# Patient Record
Sex: Female | Born: 1975 | Race: White | Hispanic: No | Marital: Married | Smoking: Never smoker
Health system: Southern US, Community
[De-identification: ages and names within clinical notes are randomized; demographics above are authoritative.]

## PROBLEM LIST (undated history)

## (undated) HISTORY — PX: OTHER SURGICAL HISTORY: SHX169

---

## 2009-02-11 ENCOUNTER — Inpatient Hospital Stay (HOSPITAL_COMMUNITY): Admission: AD | Admit: 2009-02-11 | Discharge: 2009-02-14 | Payer: Self-pay | Admitting: Obstetrics and Gynecology

## 2010-11-15 LAB — RPR: RPR Ser Ql: NONREACTIVE

## 2010-11-15 LAB — CBC
HCT: 28.1 % — ABNORMAL LOW (ref 36.0–46.0)
Hemoglobin: 13.4 g/dL (ref 12.0–15.0)
Hemoglobin: 9.8 g/dL — ABNORMAL LOW (ref 12.0–15.0)
MCV: 95.3 fL (ref 78.0–100.0)
Platelets: 132 10*3/uL — ABNORMAL LOW (ref 150–400)
RBC: 2.96 MIL/uL — ABNORMAL LOW (ref 3.87–5.11)
RBC: 4.05 MIL/uL (ref 3.87–5.11)
WBC: 9.3 10*3/uL (ref 4.0–10.5)
WBC: 9.4 10*3/uL (ref 4.0–10.5)

## 2010-12-25 NOTE — Discharge Summary (Signed)
NAMEBETHLEHEM, LANGSTAFF NO.:  [id number]   MEDICAL RECORD NO.:  [id number]          PATIENT TYPE:  INP   LOCATION:  9136                          FACILITY:  [facility]   PHYSICIAN:  Randye Lobo, M.D.   DATE OF BIRTH:  07/22/1976   DATE OF ADMISSION:  02/11/2009  DATE OF DISCHARGE:  02/14/2009                               DISCHARGE SUMMARY   FINAL DIAGNOSIS:  CANCELED DICTATION.      Leilani Able, P.A.-C.      Randye Lobo, M.D.  Electronically Signed    MB/MEDQ  D:  02/27/2009  T:  02/28/2009  Job:  [id number]

## 2012-02-14 ENCOUNTER — Institutional Professional Consult (permissible substitution): Payer: Self-pay | Admitting: Pediatrics

## 2012-02-24 ENCOUNTER — Ambulatory Visit (INDEPENDENT_AMBULATORY_CARE_PROVIDER_SITE_OTHER): Payer: Self-pay | Admitting: Pediatrics

## 2012-02-24 DIAGNOSIS — Z7681 Expectant parent(s) prebirth pediatrician visit: Secondary | ICD-10-CM

## 2012-02-24 NOTE — Progress Notes (Signed)
Pre natal counseling done 

## 2021-09-26 ENCOUNTER — Other Ambulatory Visit: Payer: Self-pay

## 2021-09-26 ENCOUNTER — Emergency Department (HOSPITAL_BASED_OUTPATIENT_CLINIC_OR_DEPARTMENT_OTHER): Payer: BC Managed Care – PPO | Admitting: Radiology

## 2021-09-26 ENCOUNTER — Encounter (HOSPITAL_BASED_OUTPATIENT_CLINIC_OR_DEPARTMENT_OTHER): Payer: Self-pay

## 2021-09-26 ENCOUNTER — Emergency Department (HOSPITAL_BASED_OUTPATIENT_CLINIC_OR_DEPARTMENT_OTHER)
Admission: EM | Admit: 2021-09-26 | Discharge: 2021-09-26 | Disposition: A | Discharge status: Home / Self Care | Payer: BC Managed Care – PPO | Attending: Emergency Medicine | Admitting: Emergency Medicine

## 2021-09-26 DIAGNOSIS — S52351A Displaced comminuted fracture of shaft of radius, right arm, initial encounter for closed fracture: Secondary | ICD-10-CM | POA: Diagnosis not present

## 2021-09-26 DIAGNOSIS — S52531A Colles' fracture of right radius, initial encounter for closed fracture: Secondary | ICD-10-CM | POA: Insufficient documentation

## 2021-09-26 DIAGNOSIS — S6991XA Unspecified injury of right wrist, hand and finger(s), initial encounter: Secondary | ICD-10-CM | POA: Diagnosis not present

## 2021-09-26 DIAGNOSIS — Y9351 Activity, roller skating (inline) and skateboarding: Secondary | ICD-10-CM | POA: Diagnosis not present

## 2021-09-26 MED ORDER — HYDROMORPHONE HCL 1 MG/ML IJ SOLN
0.5000 mg | Freq: Once | INTRAMUSCULAR | Status: AC
  Administered 2021-09-26: 0.5 mg via INTRAVENOUS
  Filled 2021-09-26: qty 1

## 2021-09-26 MED ORDER — PROPOFOL 10 MG/ML IV BOLUS
INTRAVENOUS | Status: DC | PRN
  Administered 2021-09-26: 30 mg via INTRAVENOUS
  Administered 2021-09-26: 20 mg via INTRAVENOUS

## 2021-09-26 MED ORDER — OXYCODONE HCL 5 MG PO TABS: 5.0000 mg | ORAL_TABLET | ORAL | 0 refills | Status: DC | PRN

## 2021-09-26 MED ORDER — PROPOFOL 10 MG/ML IV BOLUS
1.0000 mg/kg | Freq: Once | INTRAVENOUS | Status: DC
  Filled 2021-09-26: qty 20

## 2021-09-26 MED ORDER — ONDANSETRON HCL 4 MG/2ML IJ SOLN
4.0000 mg | Freq: Once | INTRAMUSCULAR | Status: AC
  Administered 2021-09-26: 4 mg via INTRAVENOUS
  Filled 2021-09-26: qty 2

## 2021-09-26 NOTE — ED Notes (Signed)
RT in with MD for conscious sedation procedure.  Airway standby.

## 2021-09-26 NOTE — ED Triage Notes (Signed)
Patient reports roller skating with her son when he pulled her down causing her to fall and fell backwards onto her right hand. Pt has swelling and possible deformity to right wrist.

## 2021-09-26 NOTE — Discharge Instructions (Addendum)
You were evaluated in the Emergency Department and after careful evaluation, we did not find any emergent condition requiring admission or further testing in the hospital.  You broke a bone in your wrist, which we reduced here in the emergency department for better alignment.  You still may need surgery.  Important you follow-up with Dr. Yehuda Budd the hand specialist.  Please call the number provided tomorrow morning to schedule a formal appointment time for this week.  Recommend Tylenol 1000 mg every 4-6 hours and/or Motrin 600 mg every 4-6 hours for pain.  You can use the oxycodone medication for more significant pain.  Please return to the Emergency Department if you experience any worsening of your condition.  Thank you for allowing [place] to be a part of your care.

## 2021-09-26 NOTE — ED Notes (Signed)
Dr. Pilar Plate present during sugar tong splinting

## 2021-09-26 NOTE — ED Notes (Signed)
Discharged instructions reviewed with patient and husband.  Gave verbal indications understood such.  Left with husband ambulatory without difficulty

## 2021-09-27 DIAGNOSIS — S52531A Colles' fracture of right radius, initial encounter for closed fracture: Secondary | ICD-10-CM | POA: Diagnosis not present

## 2021-09-27 NOTE — ED Provider Notes (Signed)
[facility] Emergency Department Provider Note MRN:  [id number]  Arrival date & time: 09/27/21     Chief Complaint   Hand Pain   History of Present Illness   Rebecca Oneal is a 46 y.o. year-old female with no pertinent past medical history presenting to the ED with chief complaint of hand pain.  Patient fell onto her outstretched hand while she was playing with her children.  Immediate pain and deformity to the right hand/wrist.  Denies head trauma, no other injuries or complaints.  Review of Systems  A thorough review of systems was obtained and all systems are negative except as noted in the HPI and PMH.   Patient's Health History   History reviewed. No pertinent past medical history.  Past Surgical History:  Procedure Laterality Date   wisdom teeth removal      History reviewed. No pertinent family history.  Social History   Socioeconomic History   Marital status: Married    Spouse name: Not on file   Number of children: Not on file   Years of education: Not on file   Highest education level: Not on file  Occupational History   Not on file  Tobacco Use   Smoking status: Never   Smokeless tobacco: Never  Substance and Sexual Activity   Alcohol use: Yes    Comment: occ   Drug use: Never   Sexual activity: Yes  Other Topics Concern   Not on file  Social History Narrative   Not on file   Social Determinants of Health   Financial Resource Strain: Not on file  Food Insecurity: Not on file  Transportation Needs: Not on file  Physical Activity: Not on file  Stress: Not on file  Social Connections: Not on file  Intimate Partner Violence: Not on file     Physical Exam   Vitals:   09/26/21 1745 09/26/21 1815  BP: 128/76 120/73  Pulse: 78 76  Resp: 10 17  Temp:    SpO2: 100% 100%    CONSTITUTIONAL: Well-appearing, NAD NEURO/PSYCH:  Alert and oriented x 3, no focal deficits EYES:  eyes equal and reactive ENT/NECK:  no LAD, no  JVD CARDIO: Regular rate, well-perfused, normal S1 and S2 PULM:  CTAB no wheezing or rhonchi GI/GU:  non-distended, non-tender MSK/SPINE: Gross deformity to the right wrist, neurovascularly intact distally SKIN:  no rash, atraumatic   *Additional and/or pertinent findings included in MDM below  Diagnostic and Interventional Summary    EKG Interpretation  Date/Time:    Ventricular Rate:    PR Interval:    QRS Duration:   QT Interval:    QTC Calculation:   R Axis:     Text Interpretation:         Labs Reviewed - No data to display  DG Wrist Complete Right  Final Result    DG Wrist Complete Right  Final Result      Medications  HYDROmorphone (DILAUDID) injection 0.5 mg (0.5 mg Intravenous Given 09/26/21 1553)  ondansetron (ZOFRAN) injection 4 mg (4 mg Intravenous Given 09/26/21 1554)     Procedures  /  Critical Care .Sedation  Date/Time: 09/27/2021 12:02 AM Performed by: Sabas Sous, MD Authorized by: Sabas Sous, MD   Consent:    Consent obtained:  Verbal and written   Consent given by:  Patient   Risks discussed:  Allergic reaction, dysrhythmia, nausea, vomiting, respiratory compromise necessitating ventilatory assistance and intubation, prolonged sedation necessitating reversal and prolonged hypoxia  resulting in organ damage Universal protocol:    Immediately prior to procedure, a time out was called: yes     Patient identity confirmed:  Arm band and verbally with patient Indications:    Procedure performed:  Fracture reduction   Procedure necessitating sedation performed by:  Physician performing sedation Pre-sedation assessment:    Time since last food or drink:  4 hours   ASA classification: class 1 - normal, healthy patient     Mouth opening:  3 or more finger widths   Mallampati score:  I - soft palate, uvula, fauces, pillars visible   Neck mobility: normal     Pre-sedation assessments completed and reviewed: airway patency, cardiovascular  function, hydration status, mental status, nausea/vomiting, pain level, respiratory function and temperature   Immediate pre-procedure details:    Reviewed: vital signs, relevant labs/tests and NPO status     Verified: bag valve mask available, emergency equipment available, intubation equipment available, IV patency confirmed, oxygen available and suction available   Procedure details (see MAR for exact dosages):    Preoxygenation:  Nasal cannula   Sedation:  Propofol   Intended level of sedation: deep   Analgesia:  Hydromorphone   Intra-procedure monitoring:  Blood pressure monitoring, cardiac monitor, continuous pulse oximetry, continuous capnometry, frequent LOC assessments and frequent vital sign checks   Intra-procedure events: none     Total Provider sedation time (minutes):  24 Post-procedure details:    Attendance: Constant attendance by certified staff until patient recovered     Recovery: Patient returned to pre-procedure baseline     Post-sedation assessments completed and reviewed: airway patency, cardiovascular function, hydration status, mental status, nausea/vomiting, pain level, respiratory function and temperature     Patient is stable for discharge or admission: yes     Procedure completion:  Tolerated well, no immediate complications Reduction of fracture  Date/Time: 09/27/2021 12:04 AM Performed by: Sabas Sous, MD Authorized by: Sabas Sous, MD  Consent: Verbal consent obtained. Written consent obtained. Consent given by: patient Patient understanding: patient states understanding of the procedure being performed Patient consent: the patient's understanding of the procedure matches consent given Relevant documents: relevant documents present and verified Test results: test results available and properly labeled Site marked: the operative site was marked Imaging studies: imaging studies available Patient identity confirmed: verbally with patient and arm  band Time out: Immediately prior to procedure a "time out" was called to verify the correct patient, procedure, equipment, support staff and site/side marked as required. Preparation: Patient was prepped and draped in the usual sterile fashion.  Sedation: Patient sedated: yes Sedatives: propofol Analgesia: hydromorphone  Patient tolerance: patient tolerated the procedure well with no immediate complications Comments: Reduction of right distal radius fracture   .Splint Application  Date/Time: 09/27/2021 12:04 AM Performed by: Sabas Sous, MD Authorized by: Sabas Sous, MD   Consent:    Consent obtained:  Verbal and written   Consent given by:  Patient   Risks, benefits, and alternatives were discussed: yes     Risks discussed:  Discoloration, numbness, pain and swelling   Alternatives discussed:  No treatment Universal protocol:    Procedure explained and questions answered to patient or proxy's satisfaction: yes     Immediately prior to procedure a time out was called: yes     Patient identity confirmed:  Verbally with patient and arm band Pre-procedure details:    Distal neurologic exam:  Normal   Distal perfusion: distal pulses strong and brisk  capillary refill   Procedure details:    Location:  Arm   Arm location:  R lower arm   Splint type:  Sugar tong   Supplies:  Fiberglass   Attestation: Splint applied and adjusted personally by me   Post-procedure details:    Distal neurologic exam:  Normal   Distal perfusion: distal pulses strong     Procedure completion:  Tolerated well, no immediate complications   Post-procedure imaging: reviewed    ED Course and Medical Decision Making  Initial Impression and Ddx Excluded wrist injury, clinically suspicious for Colles' fracture.  Confirmed on x-ray.  Discussed case with Dr. Yehuda Budd, appropriate for reduction and follow-up in the office this week.  See procedural details above.  Tolerated well.  Past medical/surgical  history that increases complexity of ED encounter: None  Interpretation of Diagnostics I personally reviewed the wrist x-ray and my interpretation is as follows: Colles' fracture with improvement postreduction      Patient Reassessment and Ultimate Disposition/Management Discharge home  Patient management required discussion with the following services or consulting groups:  Orthopedic Surgery  Complexity of Problems Addressed Acute complicated illness or Injury  Additional Data Reviewed and Analyzed Further history obtained from: Further history from spouse/family member  Additional Factors Impacting ED Encounter Risk Use of parenteral controlled substances and Major procedures  Elmer Sow. Pilar Plate, MD [facility] Emergency Medicine [facility] [email or web address]  Final Clinical Impressions(s) / ED Diagnoses     ICD-10-CM   1. Closed Colles' fracture of right radius, initial encounter  S52.531A     2. Injury while roller skating  Y93.51 DG Wrist Complete Right    DG Wrist Complete Right      ED Discharge Orders          Ordered    oxyCODONE (ROXICODONE) 5 MG immediate release tablet  Every 4 hours PRN,   Status:  Discontinued        09/26/21 1754    oxyCODONE (ROXICODONE) 5 MG immediate release tablet  Every 4 hours PRN        09/26/21 1849             Discharge Instructions Discussed with and Provided to Patient:    Discharge Instructions      You were evaluated in the Emergency Department and after careful evaluation, we did not find any emergent condition requiring admission or further testing in the hospital.  You broke a bone in your wrist, which we reduced here in the emergency department for better alignment.  You still may need surgery.  Important you follow-up with Dr. Yehuda Budd the hand specialist.  Please call the number provided tomorrow morning to schedule a formal appointment time for this week.  Recommend Tylenol 1000 mg every 4-6  hours and/or Motrin 600 mg every 4-6 hours for pain.  You can use the oxycodone medication for more significant pain.  Please return to the Emergency Department if you experience any worsening of your condition.  Thank you for allowing [place] to be a part of your care.       Sabas Sous, MD 09/27/21 [phone]

## 2021-09-29 DIAGNOSIS — M25531 Pain in right wrist: Secondary | ICD-10-CM | POA: Diagnosis not present

## 2021-09-29 DIAGNOSIS — S52501A Unspecified fracture of the lower end of right radius, initial encounter for closed fracture: Secondary | ICD-10-CM | POA: Diagnosis not present

## 2021-10-01 ENCOUNTER — Encounter (HOSPITAL_BASED_OUTPATIENT_CLINIC_OR_DEPARTMENT_OTHER): Payer: Self-pay | Admitting: Orthopedic Surgery

## 2021-10-01 ENCOUNTER — Other Ambulatory Visit: Payer: Self-pay

## 2021-10-01 NOTE — Progress Notes (Signed)
Spoke w/ via phone for pre-op interview--- Rebecca Oneal----      UPT         Lab results------ COVID test -----patient states asymptomatic no test needed Arrive at -------1030 NPO after MN NO Solid Food.  Clear liquids from MN until---0930 Med rec completed Medications to take morning of surgery -----Oxy IR PRN Diabetic medication ----- Patient instructed no nail polish to be worn day of surgery Patient instructed to bring photo id and insurance card day of surgery Patient aware to have Driver (ride ) / caregiver  Maricza Bhola Husband   for 24 hours after surgery  Patient Special Instructions ----- Pre-Op special Istructions ----- Patient verbalized understanding of instructions that were given at this phone interview. Patient denies shortness of breath, chest pain, fever, cough at this phone interview.

## 2021-10-03 NOTE — H&P (Signed)
Preoperative History & Physical Exam  Surgeon: Philipp Ovens, MD  Diagnosis: Right wrist fracture  Planned Procedure: Procedure(s) (LRB): OPEN REDUCTION INTERNAL FIXATION (ORIF) WRIST FRACTURE (Right)  History of Present Illness:   Patient is a 46 y.o. female with symptoms consistent with Right wrist fracture who presents for surgical intervention. The risks, benefits and alternatives of surgical intervention were discussed and informed consent was obtained prior to surgery.  Past Medical History: History reviewed. No pertinent past medical history.  Past Surgical History:  Past Surgical History:  Procedure Laterality Date   wisdom teeth removal      Medications:  Prior to Admission medications   Medication Sig Start Date End Date Taking? Authorizing Provider  oxyCODONE (ROXICODONE) 5 MG immediate release tablet Take 1 tablet (5 mg total) by mouth every 4 (four) hours as needed for severe pain. 09/26/21  Yes Sabas Sous, MD    Allergies:  Patient has no known allergies.  Review of Systems: Negative except per HPI.  Physical Exam: Alert and oriented, NAD Head and neck: no masses, normal alignment CV: pulse intact Pulm: no increased work of breathing, respirations even and unlabored Abdomen: non-distended Extremities: extremities warm and well perfused  LABS: No results found for this or any previous visit (from the past 2160 hour(s)).   Complete History and Physical exam available in the office notes  Gomez Cleverly

## 2021-10-05 ENCOUNTER — Ambulatory Visit (HOSPITAL_BASED_OUTPATIENT_CLINIC_OR_DEPARTMENT_OTHER): Payer: BC Managed Care – PPO | Admitting: Anesthesiology

## 2021-10-05 ENCOUNTER — Other Ambulatory Visit: Payer: Self-pay

## 2021-10-05 ENCOUNTER — Encounter (HOSPITAL_BASED_OUTPATIENT_CLINIC_OR_DEPARTMENT_OTHER)
Admission: RE | Disposition: A | Discharge status: Home / Self Care | Payer: Self-pay | Source: Ambulatory Visit | Attending: Orthopedic Surgery

## 2021-10-05 ENCOUNTER — Encounter (HOSPITAL_BASED_OUTPATIENT_CLINIC_OR_DEPARTMENT_OTHER): Payer: Self-pay | Admitting: Orthopedic Surgery

## 2021-10-05 ENCOUNTER — Ambulatory Visit (HOSPITAL_BASED_OUTPATIENT_CLINIC_OR_DEPARTMENT_OTHER): Payer: BC Managed Care – PPO

## 2021-10-05 ENCOUNTER — Ambulatory Visit (HOSPITAL_BASED_OUTPATIENT_CLINIC_OR_DEPARTMENT_OTHER)
Admission: RE | Admit: 2021-10-05 | Discharge: 2021-10-05 | Disposition: A | Discharge status: Home / Self Care | Payer: BC Managed Care – PPO | Source: Ambulatory Visit | Attending: Orthopedic Surgery | Admitting: Orthopedic Surgery

## 2021-10-05 DIAGNOSIS — X58XXXA Exposure to other specified factors, initial encounter: Secondary | ICD-10-CM | POA: Diagnosis not present

## 2021-10-05 DIAGNOSIS — S52501A Unspecified fracture of the lower end of right radius, initial encounter for closed fracture: Secondary | ICD-10-CM | POA: Diagnosis not present

## 2021-10-05 DIAGNOSIS — S52571A Other intraarticular fracture of lower end of right radius, initial encounter for closed fracture: Secondary | ICD-10-CM | POA: Diagnosis not present

## 2021-10-05 DIAGNOSIS — S62101A Fracture of unspecified carpal bone, right wrist, initial encounter for closed fracture: Secondary | ICD-10-CM

## 2021-10-05 HISTORY — PX: ORIF WRIST FRACTURE: SHX2133

## 2021-10-05 LAB — POCT PREGNANCY, URINE: Preg Test, Ur: NEGATIVE

## 2021-10-05 SURGERY — OPEN REDUCTION INTERNAL FIXATION (ORIF) WRIST FRACTURE
Anesthesia: Monitor Anesthesia Care | Site: Wrist | Laterality: Right

## 2021-10-05 MED ORDER — CEFAZOLIN SODIUM-DEXTROSE 2-4 GM/100ML-% IV SOLN
INTRAVENOUS | Status: AC
  Filled 2021-10-05: qty 100

## 2021-10-05 MED ORDER — BACITRACIN ZINC 500 UNIT/GM EX OINT
TOPICAL_OINTMENT | CUTANEOUS | Status: DC | PRN
  Administered 2021-10-05: 1 via TOPICAL

## 2021-10-05 MED ORDER — PHENYLEPHRINE 40 MCG/ML (10ML) SYRINGE FOR IV PUSH (FOR BLOOD PRESSURE SUPPORT)
PREFILLED_SYRINGE | INTRAVENOUS | Status: DC | PRN
  Administered 2021-10-05 (×4): 40 ug via INTRAVENOUS

## 2021-10-05 MED ORDER — FENTANYL CITRATE (PF) 100 MCG/2ML IJ SOLN
100.0000 ug | Freq: Once | INTRAMUSCULAR | Status: AC
  Administered 2021-10-05: 100 ug via INTRAVENOUS

## 2021-10-05 MED ORDER — DEXAMETHASONE SODIUM PHOSPHATE 10 MG/ML IJ SOLN
INTRAMUSCULAR | Status: DC | PRN
  Administered 2021-10-05: 5 mg

## 2021-10-05 MED ORDER — PROPOFOL 500 MG/50ML IV EMUL
INTRAVENOUS | Status: DC | PRN
  Administered 2021-10-05: 75 ug/kg/min via INTRAVENOUS

## 2021-10-05 MED ORDER — 0.9 % SODIUM CHLORIDE (POUR BTL) OPTIME
TOPICAL | Status: DC | PRN
  Administered 2021-10-05: 1000 mL

## 2021-10-05 MED ORDER — DEXMEDETOMIDINE (PRECEDEX) IN NS 20 MCG/5ML (4 MCG/ML) IV SYRINGE
PREFILLED_SYRINGE | INTRAVENOUS | Status: DC | PRN
  Administered 2021-10-05 (×3): 4 ug via INTRAVENOUS

## 2021-10-05 MED ORDER — OXYCODONE-ACETAMINOPHEN 5-325 MG PO TABS: 1.0000 | ORAL_TABLET | Freq: Four times a day (QID) | ORAL | 0 refills | Status: AC | PRN

## 2021-10-05 MED ORDER — ROPIVACAINE HCL 5 MG/ML IJ SOLN
INTRAMUSCULAR | Status: DC | PRN
  Administered 2021-10-05: 25 mL via PERINEURAL

## 2021-10-05 MED ORDER — LACTATED RINGERS IV SOLN: INTRAVENOUS | Status: DC

## 2021-10-05 MED ORDER — LIDOCAINE HCL (CARDIAC) PF 100 MG/5ML IV SOSY
PREFILLED_SYRINGE | INTRAVENOUS | Status: DC | PRN
Start: 2021-10-05 — End: 2021-10-05
  Administered 2021-10-05: 30 mg via INTRAVENOUS

## 2021-10-05 MED ORDER — MIDAZOLAM HCL 2 MG/2ML IJ SOLN
2.0000 mg | Freq: Once | INTRAMUSCULAR | Status: AC
  Administered 2021-10-05: 2 mg via INTRAVENOUS

## 2021-10-05 MED ORDER — ACETAMINOPHEN 500 MG PO TABS
ORAL_TABLET | ORAL | Status: AC
  Filled 2021-10-05: qty 2

## 2021-10-05 MED ORDER — PROPOFOL 10 MG/ML IV BOLUS
INTRAVENOUS | Status: DC | PRN
  Administered 2021-10-05: 30 mg via INTRAVENOUS

## 2021-10-05 MED ORDER — FENTANYL CITRATE (PF) 100 MCG/2ML IJ SOLN
INTRAMUSCULAR | Status: AC
  Filled 2021-10-05: qty 2

## 2021-10-05 MED ORDER — MIDAZOLAM HCL 2 MG/2ML IJ SOLN
INTRAMUSCULAR | Status: AC
  Filled 2021-10-05: qty 2

## 2021-10-05 MED ORDER — CEFAZOLIN SODIUM-DEXTROSE 2-4 GM/100ML-% IV SOLN
2.0000 g | INTRAVENOUS | Status: AC
  Administered 2021-10-05: 2 g via INTRAVENOUS

## 2021-10-05 MED ORDER — ACETAMINOPHEN 500 MG PO TABS
1000.0000 mg | ORAL_TABLET | Freq: Once | ORAL | Status: AC
  Administered 2021-10-05: 1000 mg via ORAL

## 2021-10-05 SURGICAL SUPPLY — 67 items
BIT DRILL 2.2 SS TIBIAL (BIT) ×1 IMPLANT
BLADE SURG 15 STRL LF DISP TIS (BLADE) ×2 IMPLANT
BLADE SURG 15 STRL SS (BLADE) ×4
BNDG CMPR 9X4 STRL LF SNTH (GAUZE/BANDAGES/DRESSINGS) ×1
BNDG COHESIVE 1X5 TAN STRL LF (GAUZE/BANDAGES/DRESSINGS) IMPLANT
BNDG ELASTIC 2X5.8 VLCR STR LF (GAUZE/BANDAGES/DRESSINGS) IMPLANT
BNDG ELASTIC 4X5.8 VLCR STR LF (GAUZE/BANDAGES/DRESSINGS) ×2 IMPLANT
BNDG ESMARK 4X9 LF (GAUZE/BANDAGES/DRESSINGS) ×2 IMPLANT
CORD BIPOLAR FORCEPS 12FT (ELECTRODE) ×2 IMPLANT
COVER BACK TABLE 60X90IN (DRAPES) ×2 IMPLANT
CUFF TOURN SGL QUICK 18X4 (TOURNIQUET CUFF) IMPLANT
CUFF TOURN SGL QUICK 24 (TOURNIQUET CUFF)
CUFF TRNQT CYL 24X4X16.5-23 (TOURNIQUET CUFF) IMPLANT
DRAPE EXTREMITY T 121X128X90 (DISPOSABLE) ×2 IMPLANT
DRAPE OEC MINIVIEW 54X84 (DRAPES) ×2 IMPLANT
DRAPE SHEET LG 3/4 BI-LAMINATE (DRAPES) ×2 IMPLANT
DRSG EMULSION OIL 3X3 NADH (GAUZE/BANDAGES/DRESSINGS) ×2 IMPLANT
GAUZE 4X4 16PLY ~~LOC~~+RFID DBL (SPONGE) ×2 IMPLANT
GAUZE SPONGE 4X4 12PLY STRL (GAUZE/BANDAGES/DRESSINGS) ×2 IMPLANT
GLOVE SURG NEOP MICRO LF SZ6.5 (GLOVE) ×1 IMPLANT
GLOVE SURG UNDER POLY LF SZ6.5 (GLOVE) ×1 IMPLANT
GLOVE SURG UNDER POLY LF SZ7 (GLOVE) ×1 IMPLANT
GLOVE SURG UNDER POLY LF SZ7.5 (GLOVE) ×5 IMPLANT
GOWN STRL REUS W/ TWL LRG LVL3 (GOWN DISPOSABLE) IMPLANT
GOWN STRL REUS W/TWL LRG LVL3 (GOWN DISPOSABLE) ×6 IMPLANT
HIBICLENS CHG 4% 4OZ BTL (MISCELLANEOUS) ×2 IMPLANT
K-WIRE 1.6 (WIRE) ×6
K-WIRE DBL END TROCAR 6X.045 (WIRE)
K-WIRE DBL END TROCAR 6X.062 (WIRE)
K-WIRE FX5X1.6XNS BN SS (WIRE) ×3
KIT TURNOVER CYSTO (KITS) ×2 IMPLANT
KWIRE DBL END TROCAR 6X.045 (WIRE) IMPLANT
KWIRE DBL END TROCAR 6X.062 (WIRE) IMPLANT
KWIRE FX5X1.6XNS BN SS (WIRE) IMPLANT
NEEDLE HYPO 22GX1.5 SAFETY (NEEDLE) IMPLANT
NS IRRIG 1000ML POUR BTL (IV SOLUTION) ×2 IMPLANT
NS IRRIG 500ML POUR BTL (IV SOLUTION) ×2 IMPLANT
PACK BASIN DAY SURGERY FS (CUSTOM PROCEDURE TRAY) ×2 IMPLANT
PAD CAST 4YDX4 CTTN HI CHSV (CAST SUPPLIES) ×2 IMPLANT
PADDING CAST ABS 4INX4YD NS (CAST SUPPLIES) ×1
PADDING CAST ABS COTTON 4X4 ST (CAST SUPPLIES) IMPLANT
PADDING CAST COTTON 4X4 STRL (CAST SUPPLIES) ×4
PEG LOCKING SMOOTH 2.2X16 (Screw) ×2 IMPLANT
PEG LOCKING SMOOTH 2.2X18 (Peg) ×2 IMPLANT
PEG LOCKING SMOOTH 2.2X20 (Screw) ×1 IMPLANT
PLATE CROSSLOCK NAR MINI RT (Plate) ×1 IMPLANT
SCREW LOCK 12X2.7X 3 LD (Screw) IMPLANT
SCREW LOCK 14X2.7X 3 LD TPR (Screw) IMPLANT
SCREW LOCK 16X2.7X 3 LD TPR (Screw) IMPLANT
SCREW LOCKING 2.7X12MM (Screw) ×2 IMPLANT
SCREW LOCKING 2.7X14 (Screw) ×4 IMPLANT
SCREW LOCKING 2.7X16 (Screw) ×2 IMPLANT
SLEEVE SCD COMPRESS KNEE MED (STOCKING) IMPLANT
SLING ARM FOAM STRAP MED (SOFTGOODS) ×1 IMPLANT
SPLINT PLASTER CAST XFAST 3X15 (CAST SUPPLIES) IMPLANT
SPLINT PLASTER XTRA FASTSET 3X (CAST SUPPLIES) ×1
STRIP CLOSURE SKIN 1/2X4 (GAUZE/BANDAGES/DRESSINGS) ×2 IMPLANT
SUCTION FRAZIER HANDLE 10FR (MISCELLANEOUS)
SUCTION TUBE FRAZIER 10FR DISP (MISCELLANEOUS) IMPLANT
SUT CHROMIC 4 0 PS 2 18 (SUTURE) IMPLANT
SUT ETHILON 4 0 PS 2 18 (SUTURE) ×2 IMPLANT
SYR 10ML LL (SYRINGE) IMPLANT
SYR BULB EAR ULCER 3OZ GRN STR (SYRINGE) ×2 IMPLANT
TOWEL OR 17X26 10 PK STRL BLUE (TOWEL DISPOSABLE) ×2 IMPLANT
TRAY DSU PREP LF (CUSTOM PROCEDURE TRAY) ×2 IMPLANT
TUBE CONNECTING 12X1/4 (SUCTIONS) IMPLANT
UNDERPAD 30X36 HEAVY ABSORB (UNDERPADS AND DIAPERS) ×2 IMPLANT

## 2021-10-05 NOTE — Op Note (Signed)
OPERATIVE NOTE  DATE OF PROCEDURE: 10/05/2021  SURGEONS: Primary: Orene Desanctis, MD  PREOPERATIVE DIAGNOSIS: Right wrist fracture  POSTOPERATIVE DIAGNOSIS: Same  NAME OF PROCEDURE:    Right distal radius open reduction internal fixation, Intra- articular, 3 fragments 2.    Right wrist brachioradialis tenotomy  3.    Right wrist radiographs four views with intraoperative interpretation  ANESTHESIA: Regional Block + MAC  SKIN PREPARATION: Hibiclens  ESTIMATED BLOOD LOSS: Minimal  IMPLANTS: Biomet DVR Crosslock volar plate and screws  Implant Name Type Inv. Item Serial No. Manufacturer Lot No. LRB No. Used Action  PEG LOCKING SMOOTH 2.2X18 - [id number] Peg PEG LOCKING SMOOTH 2.2X18  [employer](ORTH,TRAU,BIO,SG)  Right 2 Implanted  PEG LOCKING SMOOTH 2.2X20 - [id number] Screw PEG LOCKING SMOOTH 2.2X20  [employer](ORTH,TRAU,BIO,SG)  Right 1 Implanted  SCREW LOCKING 2.7X16 - [id number] Screw SCREW LOCKING 2.7X16  [employer](ORTH,TRAU,BIO,SG)  Right 1 Implanted  PEG LOCKING SMOOTH 2.2X16 - [id number] Screw PEG LOCKING SMOOTH 2.2X16  [employer](ORTH,TRAU,BIO,SG)  Right 2 Implanted  SCREW LOCKING 2.7X14 - [id number] Screw SCREW LOCKING 2.7X14  [employer](ORTH,TRAU,BIO,SG)  Right 2 Implanted  PLATE CROSSLOCK NAR MINI RT - [id number] Plate PLATE CROSSLOCK NAR MINI RT  [employer](ORTH,TRAU,BIO,SG)  Right 1 Implanted    INDICATIONS:  Rebecca Oneal is a 46 y.o. female who has the above preoperative diagnosis. The patient has decided to proceed with surgical intervention.  Risks, benefits and alternatives of operative management were discussed including, but not limited to, risks of anesthesia complications, infection, pain, persistent symptoms, stiffness, need for future surgery.  The patient understands, agrees and elects to proceed with surgery.    DESCRIPTION OF PROCEDURE: The patient was met in the pre-operative area and their identity was verified.  The operative location and laterality was  also verified and marked.  The patient was brought to the OR and was placed supine on the table.  After repeat patient identification with the operative team anesthesia was provided and the patient was prepped and draped in the usual sterile fashion.  A final timeout was performed verifying the correction patient, procedure, location and laterality.  Preoperative antibiotics were administered. The right upper extremity was exsanguinated with an Esmarch and tourniquet inflated to 264mHg. Under loupe magnification, an incision was made directly over the flexor carpi radialis (FCR) tendon. Bipolar was utilized for hemostasis. The roof of the FCR tendon sheath was incised. The FCR tendon was then retracted ulnarly to protect the palmar cutaneous branch of the median nerve. Subsequently, the floor of the FCR tendon sheath was incised over the distal end of the radius.  The flexor pollicis longus (FPL) was swept ulnarly to reveal the pronator quadratus.  The pronator quadratus fascia was incised from its distal and radial borders. A periosteal elevator was utilized to mobilize the pronator quadratus muscle off the distal radius.  The fracture site was irrigated and prepared for reduction with a freer and adson forceps. There were 3 distal radius fracture fragments. The brachioradialis tendon insertion was released to facilitate reduction.  This release was performed by identifying the broad insertion of the brachiradialis tendon and also identifying the 1st dorsal compartment tendons.  The 1st dorsal compartment tendons were protected, the broad tendon insertion was released under direct visualization. The fracture was reduced and provisionally fixed with K-wires. We then selected a proper length and width volar plate.  The plate was placed on the distal end of the radius with the fracture reduced and secured to the bone. Using mini C-arm  the fracture reduction and position of the plate were deemed to be satisfactory.   We proceeded with securing the plate to the radius with the 1 bicortical nonlocking screw in the oblong portion of the shaft.  With the intermediate column reduced, we secured the distal end of the plate with 2 screws in the distal ulnar portion of plate.  We again used the C-arm to verify satisfactory plate position as well as fracture reduction. The radial column was then reduced and radial styloid locking screws were placed. The remainder shaft screws were placed through the plate. We used the mini C-arm to verify satisfactory plate position, screw lengths and fracture reduction. The DRUJ was then tested in neutral, pronation and supination and was found to be stable.The tourniquet was deflated. Meticulous hemostasis was obtained. The incision was copiously irrigated with normal saline and closed with interrupted 4-0 nylon horizontal mattress sutures. The incision was covered with xeroform, sterile guaze, webril and well padded short arm splint. The fingers were pink, warm and well perfused. All counts were correct. The patient was awoken from anesthesia and brought to PACU for recovery in stable condition.   Matt Holmes, MD Orthopaedic Hand Surgery

## 2021-10-05 NOTE — Anesthesia Procedure Notes (Signed)
Anesthesia Regional Block: Supraclavicular block   Pre-Anesthetic Checklist: , timeout performed,  Correct Patient, Correct Site, Correct Laterality,  Correct Procedure, Correct Position, site marked,  Risks and benefits discussed,  Surgical consent,  Pre-op evaluation,  At surgeon's request and post-op pain management  Laterality: Right  Prep: Maximum Sterile Barrier Precautions used, chloraprep       Needles:  Injection technique: Single-shot  Needle Type: Echogenic Stimulator Needle     Needle Length: 5cm  Needle Gauge: 22     Additional Needles:   Procedures:,,,, ultrasound used (permanent image in chart),,    Narrative:  Start time: 10/05/2021 11:30 AM End time: 10/05/2021 11:33 AM Injection made incrementally with aspirations every 5 mL.  Performed by: Personally  Anesthesiologist: Elmer Picker, MD  Additional Notes: Monitors applied. No increased pain on injection. No increased resistance to injection. Injection made in 5cc increments. Good needle visualization. Patient tolerated procedure well.

## 2021-10-05 NOTE — Transfer of Care (Signed)
Immediate Anesthesia Transfer of Care Note  Patient: Rebecca Oneal  Procedure(s) Performed: OPEN REDUCTION INTERNAL FIXATION (ORIF) WRIST FRACTURE (Right: Wrist)  Patient Location: PACU  Anesthesia Type:MAC and Regional  Level of Consciousness: awake, alert  and oriented  Airway & Oxygen Therapy: Patient Spontanous Breathing  Post-op Assessment: Report given to RN and Post -op Vital signs reviewed and stable  Post vital signs: Reviewed and stable  Last Vitals:  Vitals Value Taken Time  BP    Temp    Pulse    Resp    SpO2      Last Pain:  Vitals:   10/05/21 1102  TempSrc: Oral  PainSc: 0-No pain      Patients Stated Pain Goal: 3 (56/38/93 7342)  Complications: No notable events documented.

## 2021-10-05 NOTE — Interval H&P Note (Signed)
History and Physical Interval Note:  10/05/2021 12:11 PM  Rebecca Oneal  has presented today for surgery, with the diagnosis of Right wrist fracture.  The various methods of treatment have been discussed with the patient and family. After consideration of risks, benefits and other options for treatment, the patient has consented to  Procedure(s) with comments: OPEN REDUCTION INTERNAL FIXATION (ORIF) WRIST FRACTURE (Right) - with MAC anesthesia needs 90 minutes as a surgical intervention.  The patient's history has been reviewed, patient examined, no change in status, stable for surgery.  I have reviewed the patient's chart and labs.  Questions were answered to the patient's satisfaction.     Orene Desanctis

## 2021-10-05 NOTE — Anesthesia Preprocedure Evaluation (Addendum)
Anesthesia Evaluation  Patient identified by MRN, date of birth, ID band Patient awake    Reviewed: Allergy & Precautions, NPO status , Patient's Chart, lab work & pertinent test results  Airway Mallampati: III  TM Distance: >3 FB Neck ROM: Full  Mouth opening: Limited Mouth Opening  Dental no notable dental hx. (+) Teeth Intact, Dental Advisory Given   Pulmonary neg pulmonary ROS,    Pulmonary exam normal breath sounds clear to auscultation       Cardiovascular negative cardio ROS Normal cardiovascular exam Rhythm:Regular Rate:Normal     Neuro/Psych negative neurological ROS  negative psych ROS   GI/Hepatic negative GI ROS, Neg liver ROS,   Endo/Other  negative endocrine ROS  Renal/GU negative Renal ROS  negative genitourinary   Musculoskeletal negative musculoskeletal ROS (+)   Abdominal   Peds  Hematology negative hematology ROS (+)   Anesthesia Other Findings   Reproductive/Obstetrics                            Anesthesia Physical Anesthesia Plan  ASA: 1  Anesthesia Plan: MAC and Regional   Post-op Pain Management: Tylenol PO (pre-op)* and Regional block*   Induction: Intravenous  PONV Risk Score and Plan: 2 and Propofol infusion, Treatment may vary due to age or medical condition, Midazolam and Dexamethasone  Airway Management Planned: Natural Airway  Additional Equipment:   Intra-op Plan:   Post-operative Plan:   Informed Consent: I have reviewed the patients History and Physical, chart, labs and discussed the procedure including the risks, benefits and alternatives for the proposed anesthesia with the patient or authorized representative who has indicated his/her understanding and acceptance.     Dental advisory given  Plan Discussed with: CRNA  Anesthesia Plan Comments:         Anesthesia Quick Evaluation

## 2021-10-05 NOTE — Progress Notes (Signed)
Assisted Dr. Woodrum with right, ultrasound guided, supraclavicular block. Side rails up, monitors on throughout procedure. See vital signs in flow sheet. Tolerated Procedure well. 

## 2021-10-05 NOTE — Discharge Instructions (Addendum)
  Orthopaedic Hand Surgery Discharge Instructions  WEIGHT BEARING STATUS: Non weight bearing on operative extremity  DRESSING CARE: Please keep your dressing/splint/cast clean and dry until your follow-up appointment. You may shower by placing a waterproof covering over your dressing/splint/cast. Contact your surgeon if your splint/cast gets wet. It will need to be changed to prevent skin breakdown.  PAIN CONTROL: First line medications for post operative pain control are Tylenol (acetaminophen) and Motrin (ibuprofen) if you are able to take these medications. If you have been prescribed a medication these can be taken as breakthrough pain medications. Please note that some narcotic pain medication has acetaminophen added and you should never consume more than 4,000mg of acetaminophen in 24-hour period. Please note that if you are given Toradol (ketorolac) you should not take similar medications such as ibuprofen or naproxen.  DISCHARGE MEDICATIONS: If you have been prescribed medication it was sent electronically to your pharmacy. No changes have been made to your home medications.  ICE/ELEVATION: Ice and elevate your injured extremity as needed. Avoid direct contact of ice with skin.   BANDAGE FEELS TOO TIGHT: If your bandage feels too tight, first make sure you are elevating your fingers as much as possible. The outer layer of the bandage can be unwrapped and reapplied more loosely. If no improvement, you may carefully cut the inner layer longitudinally until the pressure has resolved and then rewrap the outer layer. If you are not comfortable with these instructions, please call the office and the bandage can be changed for you.   FOLLOW UP: You will be called after surgery with an appointment date and time, however if you have not received a phone call within 3 days, please call during regular office hours at [phone] to schedule a post operative appointment.  Please Seek Medical Attention  if: Call MD for: pain or pressure in chest, jaw, arm, back, neck  Call MD for: temperature greater than 101 F for more than 24 hrs Call MD for: difficulty breathing Call MD for: incision redness, bleeding, drainage  Call MD for: palpitations or feeling that the heart is racing  Call MD for: increased swelling in arm, leg, ankle, or abdomen  Call MD for: lightheadedness, dizziness, fainting Call 911 or go to ER for any medical emergency if you are not able to get in touch with your doctor   J. Reid Spears, MD Orthopaedic Hand Surgeon [facility] Office number: [phone] [address] [place], [place] [address]  Post Anesthesia Home Care Instructions  Activity: Get plenty of rest for the remainder of the day. A responsible individual must stay with you for 24 hours following the procedure.  For the next 24 hours, DO NOT: -Drive a car -Operate machinery -Drink alcoholic beverages -Take any medication unless instructed by your physician -Make any legal decisions or sign important papers.  Meals: Start with liquid foods such as gelatin or soup. Progress to regular foods as tolerated. Avoid greasy, spicy, heavy foods. If nausea and/or vomiting occur, drink only clear liquids until the nausea and/or vomiting subsides. Call your physician if vomiting continues.  Special Instructions/Symptoms: Your throat may feel dry or sore from the anesthesia or the breathing tube placed in your throat during surgery. If this causes discomfort, gargle with warm salt water. The discomfort should disappear within 24 hours.      

## 2021-10-06 NOTE — Anesthesia Postprocedure Evaluation (Signed)
Anesthesia Post Note  Patient: Rebecca Oneal  Procedure(s) Performed: OPEN REDUCTION INTERNAL FIXATION (ORIF) WRIST FRACTURE (Right: Wrist)     Patient location during evaluation: PACU Anesthesia Type: Regional and MAC Level of consciousness: awake and alert Pain management: pain level controlled Vital Signs Assessment: post-procedure vital signs reviewed and stable Respiratory status: spontaneous breathing, nonlabored ventilation, respiratory function stable and patient connected to nasal cannula oxygen Cardiovascular status: stable and blood pressure returned to baseline Postop Assessment: no apparent nausea or vomiting Anesthetic complications: no   No notable events documented.  Last Vitals:  Vitals:   10/05/21 1350 10/05/21 1420  BP: 106/86 113/63  Pulse: 80 73  Resp: 15 16  Temp: 36.6 C 36.6 C  SpO2: 96% 98%    Last Pain:  Vitals:   10/05/21 1420  TempSrc:   PainSc: 0-No pain                 Nyana Haren L Shamica Moree

## 2021-10-07 ENCOUNTER — Encounter (HOSPITAL_BASED_OUTPATIENT_CLINIC_OR_DEPARTMENT_OTHER): Payer: Self-pay | Admitting: Orthopedic Surgery

## 2021-10-07 DIAGNOSIS — S52501D Unspecified fracture of the lower end of right radius, subsequent encounter for closed fracture with routine healing: Secondary | ICD-10-CM | POA: Diagnosis not present

## 2021-10-21 DIAGNOSIS — S52501D Unspecified fracture of the lower end of right radius, subsequent encounter for closed fracture with routine healing: Secondary | ICD-10-CM | POA: Diagnosis not present

## 2021-10-21 DIAGNOSIS — M25531 Pain in right wrist: Secondary | ICD-10-CM | POA: Diagnosis not present

## 2021-11-06 DIAGNOSIS — M25531 Pain in right wrist: Secondary | ICD-10-CM | POA: Diagnosis not present

## 2021-11-13 DIAGNOSIS — M25531 Pain in right wrist: Secondary | ICD-10-CM | POA: Diagnosis not present

## 2021-11-18 DIAGNOSIS — S52501D Unspecified fracture of the lower end of right radius, subsequent encounter for closed fracture with routine healing: Secondary | ICD-10-CM | POA: Diagnosis not present

## 2021-11-18 DIAGNOSIS — M25531 Pain in right wrist: Secondary | ICD-10-CM | POA: Diagnosis not present

## 2021-11-27 DIAGNOSIS — M25531 Pain in right wrist: Secondary | ICD-10-CM | POA: Diagnosis not present

## 2021-12-02 DIAGNOSIS — M25531 Pain in right wrist: Secondary | ICD-10-CM | POA: Diagnosis not present

## 2021-12-09 DIAGNOSIS — S52501A Unspecified fracture of the lower end of right radius, initial encounter for closed fracture: Secondary | ICD-10-CM | POA: Diagnosis not present

## 2021-12-11 DIAGNOSIS — M25531 Pain in right wrist: Secondary | ICD-10-CM | POA: Diagnosis not present

## 2021-12-18 DIAGNOSIS — M25531 Pain in right wrist: Secondary | ICD-10-CM | POA: Diagnosis not present

## 2021-12-25 DIAGNOSIS — Z1151 Encounter for screening for human papillomavirus (HPV): Secondary | ICD-10-CM | POA: Diagnosis not present

## 2021-12-25 DIAGNOSIS — Z01419 Encounter for gynecological examination (general) (routine) without abnormal findings: Secondary | ICD-10-CM | POA: Diagnosis not present

## 2021-12-25 DIAGNOSIS — Z3202 Encounter for pregnancy test, result negative: Secondary | ICD-10-CM | POA: Diagnosis not present

## 2021-12-25 DIAGNOSIS — N926 Irregular menstruation, unspecified: Secondary | ICD-10-CM | POA: Diagnosis not present

## 2021-12-25 DIAGNOSIS — Z6822 Body mass index (BMI) 22.0-22.9, adult: Secondary | ICD-10-CM | POA: Diagnosis not present

## 2021-12-25 DIAGNOSIS — Z124 Encounter for screening for malignant neoplasm of cervix: Secondary | ICD-10-CM | POA: Diagnosis not present

## 2021-12-28 DIAGNOSIS — M25531 Pain in right wrist: Secondary | ICD-10-CM | POA: Diagnosis not present

## 2022-01-06 DIAGNOSIS — M25531 Pain in right wrist: Secondary | ICD-10-CM | POA: Diagnosis not present

## 2022-01-12 DIAGNOSIS — M25631 Stiffness of right wrist, not elsewhere classified: Secondary | ICD-10-CM | POA: Diagnosis not present

## 2022-01-20 DIAGNOSIS — M25531 Pain in right wrist: Secondary | ICD-10-CM | POA: Diagnosis not present

## 2022-01-20 DIAGNOSIS — S52501A Unspecified fracture of the lower end of right radius, initial encounter for closed fracture: Secondary | ICD-10-CM | POA: Diagnosis not present

## 2022-01-25 DIAGNOSIS — N924 Excessive bleeding in the premenopausal period: Secondary | ICD-10-CM | POA: Diagnosis not present

## 2022-01-27 DIAGNOSIS — M25531 Pain in right wrist: Secondary | ICD-10-CM | POA: Diagnosis not present

## 2022-02-01 DIAGNOSIS — M25631 Stiffness of right wrist, not elsewhere classified: Secondary | ICD-10-CM | POA: Diagnosis not present

## 2022-06-28 ENCOUNTER — Encounter: Payer: Self-pay | Admitting: Internal Medicine

## 2022-06-28 ENCOUNTER — Ambulatory Visit (INDEPENDENT_AMBULATORY_CARE_PROVIDER_SITE_OTHER): Payer: BC Managed Care – PPO | Admitting: Internal Medicine

## 2022-06-28 VITALS — BP 112/60 | HR 101 | Temp 98.6°F | Ht 70.0 in | Wt 162.0 lb

## 2022-06-28 DIAGNOSIS — Z0000 Encounter for general adult medical examination without abnormal findings: Secondary | ICD-10-CM | POA: Diagnosis not present

## 2022-06-28 DIAGNOSIS — Z1211 Encounter for screening for malignant neoplasm of colon: Secondary | ICD-10-CM | POA: Diagnosis not present

## 2022-06-28 DIAGNOSIS — Z23 Encounter for immunization: Secondary | ICD-10-CM

## 2022-06-28 LAB — COMPREHENSIVE METABOLIC PANEL
ALT: 12 U/L (ref 0–35)
AST: 17 U/L (ref 0–37)
Albumin: 4.3 g/dL (ref 3.5–5.2)
Alkaline Phosphatase: 44 U/L (ref 39–117)
BUN: 9 mg/dL (ref 6–23)
CO2: 29 mEq/L (ref 19–32)
Calcium: 9.1 mg/dL (ref 8.4–10.5)
Chloride: 102 mEq/L (ref 96–112)
Creatinine, Ser: 0.7 mg/dL (ref 0.40–1.20)
GFR: 104.68 mL/min (ref >60.00)
Glucose, Bld: 85 mg/dL (ref 70–99)
Potassium: 4 mEq/L (ref 3.5–5.1)
Sodium: 137 mEq/L (ref 135–145)
Total Bilirubin: 0.5 mg/dL (ref 0.2–1.2)
Total Protein: 7.3 g/dL (ref 6.0–8.3)

## 2022-06-28 LAB — LIPID PANEL
Cholesterol: 183 mg/dL (ref 0–200)
HDL: 78.7 mg/dL (ref >39.00)
LDL Cholesterol: 84 mg/dL (ref 0–99)
NonHDL: 104.12
Total CHOL/HDL Ratio: 2
Triglycerides: 99 mg/dL (ref 0.0–149.0)
VLDL: 19.8 mg/dL (ref 0.0–40.0)

## 2022-06-28 LAB — CBC
HCT: 41.5 % (ref 36.0–46.0)
Hemoglobin: 14.2 g/dL (ref 12.0–15.0)
MCHC: 34.2 g/dL (ref 30.0–36.0)
MCV: 91.9 fl (ref 78.0–100.0)
Platelets: 249 10*3/uL (ref 150.0–400.0)
RBC: 4.51 Mil/uL (ref 3.87–5.11)
RDW: 12.6 % (ref 11.5–15.5)
WBC: 6.6 10*3/uL (ref 4.0–10.5)

## 2022-06-28 NOTE — Progress Notes (Signed)
   Subjective:   Patient ID: Rebecca Oneal, female    DOB: 1976-04-29, 46 y.o.   MRN: [id number]  HPI The patient is a new 46 YO female coming in for physical.  PMH, FMH, social history reviewed and updated  Review of Systems  Constitutional: Negative.   HENT: Negative.    Eyes: Negative.   Respiratory:  Negative for cough, chest tightness and shortness of breath.   Cardiovascular:  Negative for chest pain, palpitations and leg swelling.  Gastrointestinal:  Negative for abdominal distention, abdominal pain, constipation, diarrhea, nausea and vomiting.  Musculoskeletal: Negative.   Skin: Negative.   Neurological: Negative.   Psychiatric/Behavioral: Negative.      Objective:  Physical Exam Constitutional:      Appearance: She is well-developed.  HENT:     Head: Normocephalic and atraumatic.  Cardiovascular:     Rate and Rhythm: Normal rate and regular rhythm.  Pulmonary:     Effort: Pulmonary effort is normal. No respiratory distress.     Breath sounds: Normal breath sounds. No wheezing or rales.  Abdominal:     General: Bowel sounds are normal. There is no distension.     Palpations: Abdomen is soft.     Tenderness: There is no abdominal tenderness. There is no rebound.  Musculoskeletal:     Cervical back: Normal range of motion.  Skin:    General: Skin is warm and dry.  Neurological:     Mental Status: She is alert and oriented to person, place, and time.     Coordination: Coordination normal.     Vitals:   06/28/22 0909  BP: 112/60  Pulse: (!) 101  Temp: 98.6 F (37 C)  TempSrc: Oral  SpO2: 99%  Weight: 162 lb (73.5 kg)  Height: 5\' 10"  (1.778 m)    Assessment & Plan:  Flu shot given at visit

## 2022-06-28 NOTE — Patient Instructions (Signed)
We will get the colon screening sent out to you.

## 2022-06-28 NOTE — Assessment & Plan Note (Signed)
Flu shot given. Covid-19 counseled. Tetanus counseled. Cologuard ordered. Mammogram getting records, pap smear getting records. Counseled about sun safety and mole surveillance. Counseled about the dangers of distracted driving. Given 10 year screening recommendations.

## 2022-07-15 DIAGNOSIS — Z79899 Other long term (current) drug therapy: Secondary | ICD-10-CM | POA: Diagnosis not present

## 2022-07-15 DIAGNOSIS — R41840 Attention and concentration deficit: Secondary | ICD-10-CM | POA: Diagnosis not present

## 2022-07-15 DIAGNOSIS — D251 Intramural leiomyoma of uterus: Secondary | ICD-10-CM | POA: Diagnosis not present

## 2022-07-19 DIAGNOSIS — R41840 Attention and concentration deficit: Secondary | ICD-10-CM | POA: Diagnosis not present

## 2022-07-20 DIAGNOSIS — Z79899 Other long term (current) drug therapy: Secondary | ICD-10-CM | POA: Diagnosis not present

## 2022-07-20 DIAGNOSIS — R41840 Attention and concentration deficit: Secondary | ICD-10-CM | POA: Diagnosis not present

## 2022-08-10 DIAGNOSIS — Z1211 Encounter for screening for malignant neoplasm of colon: Secondary | ICD-10-CM | POA: Diagnosis not present

## 2022-08-15 LAB — COLOGUARD: COLOGUARD: NEGATIVE

## 2022-08-17 LAB — COLOGUARD: Cologuard: NEGATIVE

## 2023-05-30 ENCOUNTER — Ambulatory Visit: Payer: BC Managed Care – PPO | Admitting: Internal Medicine

## 2023-06-03 ENCOUNTER — Encounter: Payer: Self-pay | Admitting: Internal Medicine

## 2023-06-03 ENCOUNTER — Ambulatory Visit: Payer: BC Managed Care – PPO | Admitting: Internal Medicine

## 2023-06-03 VITALS — BP 100/80 | HR 84 | Temp 98.9°F | Ht 70.0 in | Wt 164.0 lb

## 2023-06-03 DIAGNOSIS — F909 Attention-deficit hyperactivity disorder, unspecified type: Secondary | ICD-10-CM | POA: Insufficient documentation

## 2023-06-03 MED ORDER — ATOMOXETINE HCL 40 MG PO CAPS: 80.0000 mg | ORAL_CAPSULE | Freq: Every day | ORAL | 0 refills | Status: DC

## 2023-06-03 NOTE — Patient Instructions (Signed)
We have sent in strattera 40 mg tablets to take 1 pill daily for first 1-2 weeks or up to a month. If not working well enough we can increase to 80 mg daily (2 tablets). Let us know how you are doing in 2-4 weeks.

## 2023-06-03 NOTE — Assessment & Plan Note (Addendum)
Will get supportive information. She is interested in treatment. We will start strattera 40 mg daily for at least 1 week and up to 1 month to see effectiveness. Can increase to 80 mg daily as needed.

## 2023-06-03 NOTE — Progress Notes (Signed)
   Subjective:   Patient ID: Rebecca Oneal, female    DOB: [birth year], 47 y.o.   MRN: [id number]  HPI The patient is a 47 YO female coming in for discussion of new problem of ADHD. She states testing from [place] attention specialist and did not want medication at the time.   Review of Systems  Constitutional: Negative.   HENT: Negative.    Eyes: Negative.   Respiratory:  Negative for cough, chest tightness and shortness of breath.   Cardiovascular:  Negative for chest pain, palpitations and leg swelling.  Gastrointestinal:  Negative for abdominal distention, abdominal pain, constipation, diarrhea, nausea and vomiting.  Musculoskeletal: Negative.   Skin: Negative.   Neurological: Negative.   Psychiatric/Behavioral:  Positive for decreased concentration.     Objective:  Physical Exam Constitutional:      Appearance: She is well-developed.  HENT:     Head: Normocephalic and atraumatic.  Cardiovascular:     Rate and Rhythm: Normal rate and regular rhythm.  Pulmonary:     Effort: Pulmonary effort is normal. No respiratory distress.     Breath sounds: Normal breath sounds. No wheezing or rales.  Abdominal:     General: Bowel sounds are normal. There is no distension.     Palpations: Abdomen is soft.     Tenderness: There is no abdominal tenderness. There is no rebound.  Musculoskeletal:     Cervical back: Normal range of motion.  Skin:    General: Skin is warm and dry.  Neurological:     Mental Status: She is alert and oriented to person, place, and time.     Coordination: Coordination normal.     Vitals:   06/03/23 1006  BP: 100/80  Pulse: 84  Temp: 98.9 F (37.2 C)  TempSrc: Oral  SpO2: 99%  Weight: 164 lb (74.4 kg)  Height: 5\' 10"  (1.778 m)    Assessment & Plan:

## 2023-06-10 ENCOUNTER — Encounter: Payer: Self-pay | Admitting: Internal Medicine

## 2023-06-10 NOTE — Telephone Encounter (Signed)
Please advise as MD is out of office until Monday

## 2023-06-27 MED ORDER — ATOMOXETINE HCL 10 MG PO CAPS: 60.0000 mg | ORAL_CAPSULE | Freq: Every day | ORAL | 0 refills | Status: DC

## 2023-06-27 NOTE — Addendum Note (Signed)
Addended by: Hillard Danker A on: 06/27/2023 03:51 PM   Modules accepted: Orders

## 2023-06-30 ENCOUNTER — Other Ambulatory Visit: Payer: Self-pay | Admitting: Internal Medicine

## 2023-09-13 ENCOUNTER — Encounter: Payer: Self-pay | Admitting: Internal Medicine

## 2023-09-15 MED ORDER — ATOMOXETINE HCL 40 MG PO CAPS: 40.0000 mg | ORAL_CAPSULE | Freq: Every day | ORAL | 1 refills | Status: DC

## 2023-10-23 IMAGING — DX DG WRIST COMPLETE 3+V*R*
3 series · 3 of 3 positions shown · non-contrast
Comparison: Earlier same day radiograph at 8780 hours

CLINICAL DATA: Postreduction.

EXAM:
RIGHT WRIST - COMPLETE 3+ VIEW

[wrist ap]
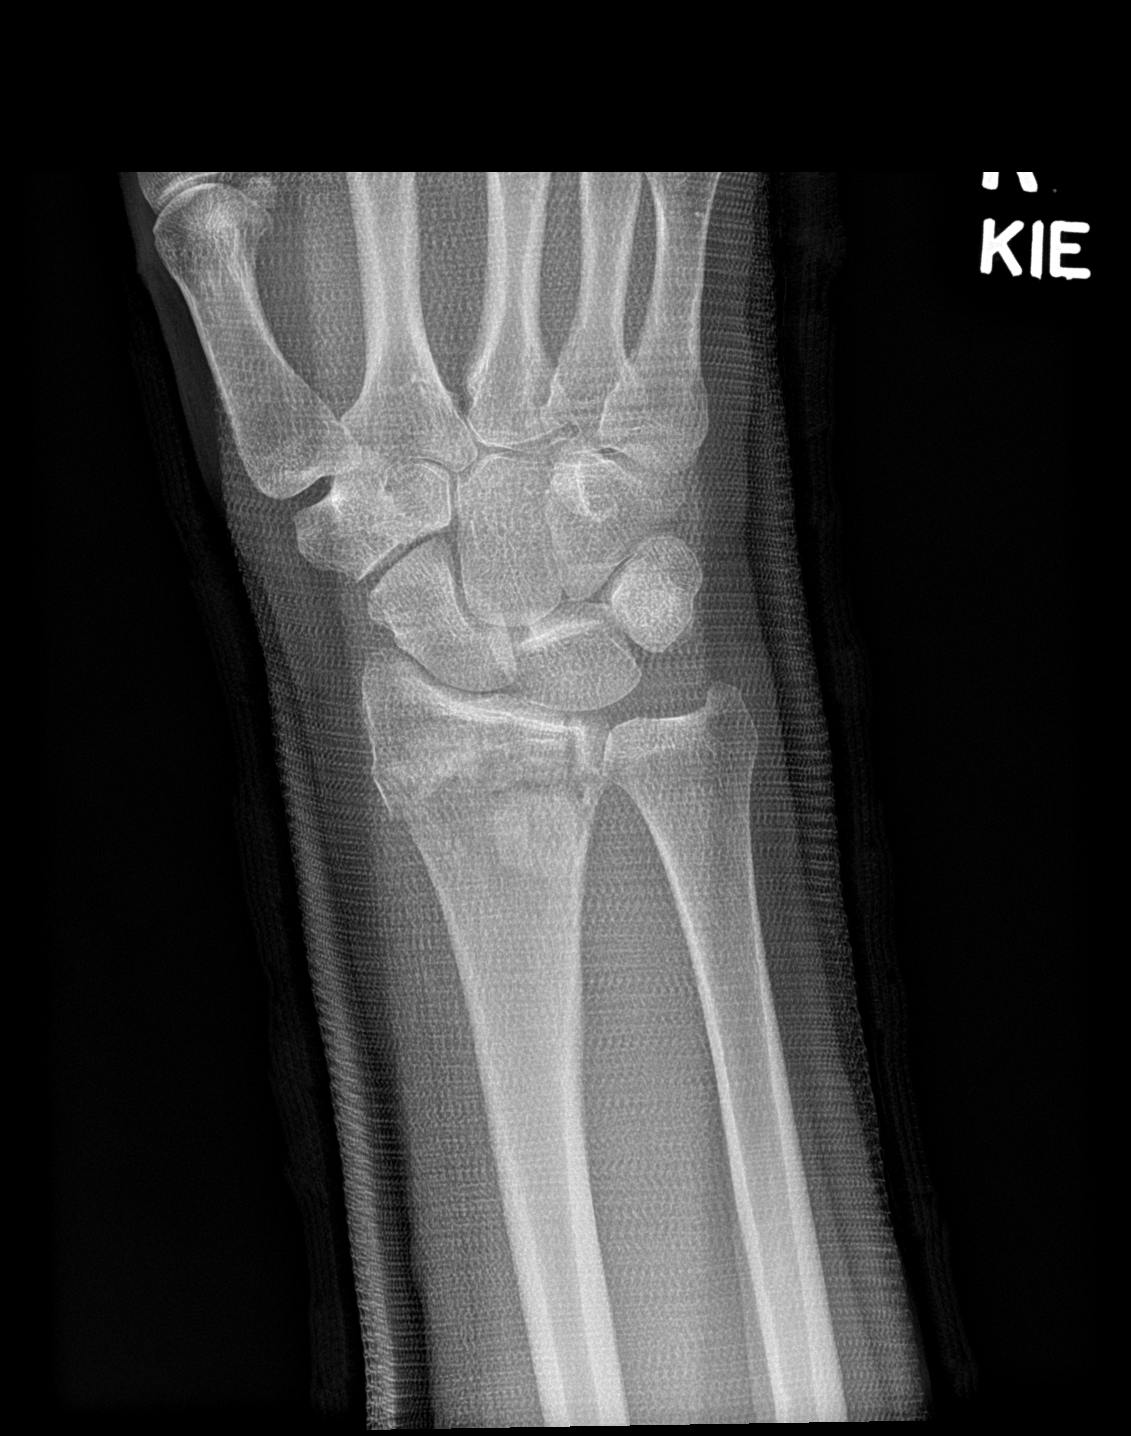

[wrist obl]
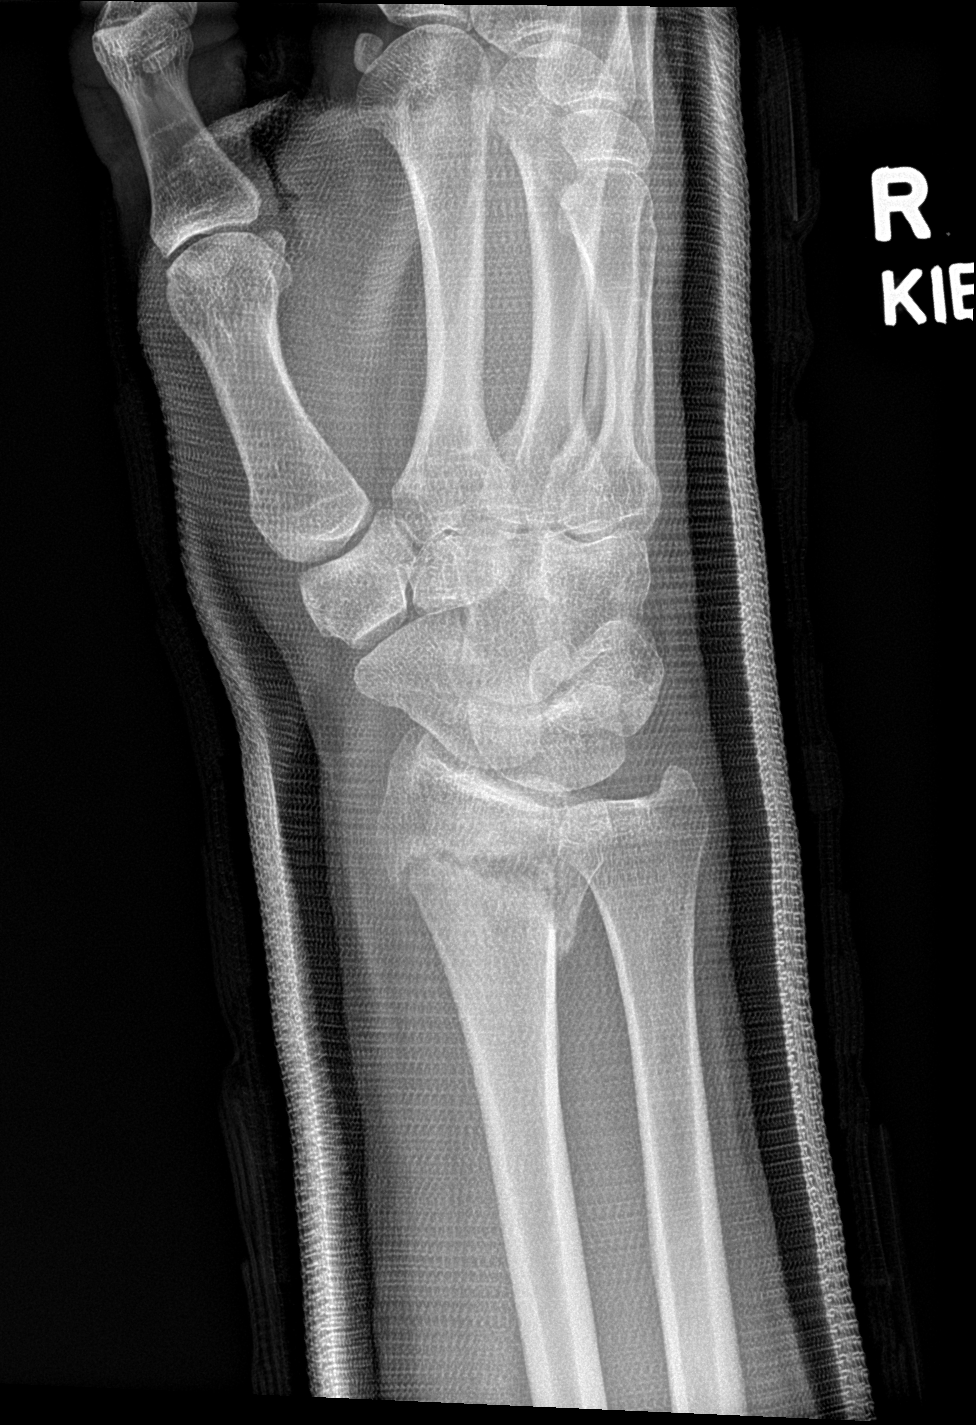

[wrist lat]
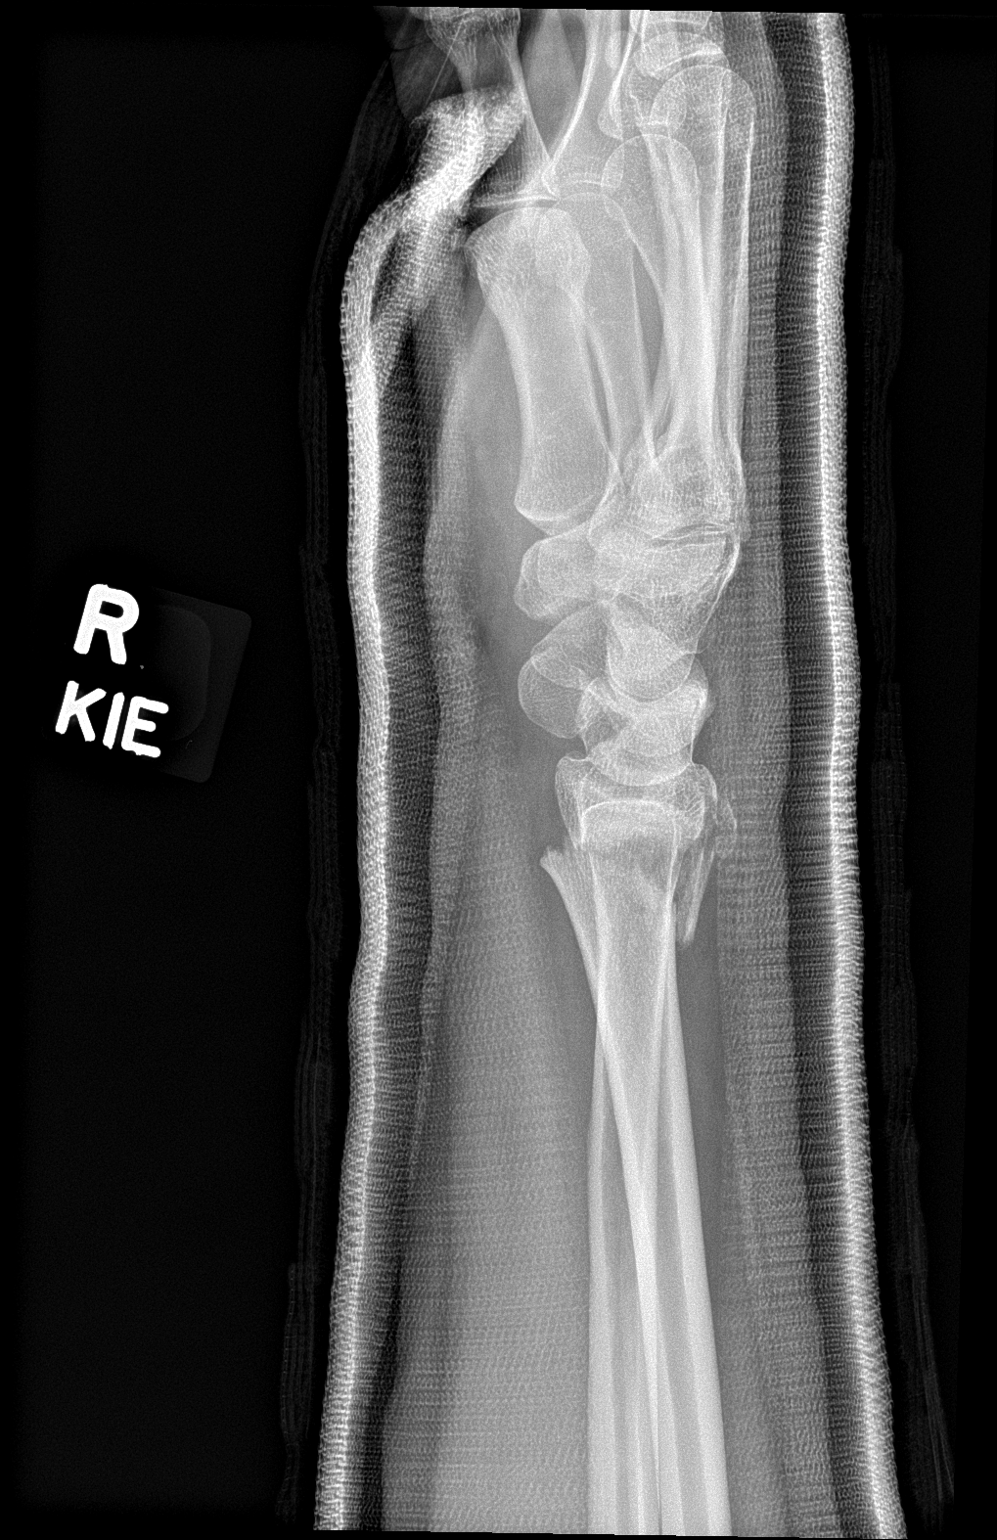

[3 of 3 positions shown; findings below may reference images not displayed]

FINDINGS: Overlying splint material obscures fine osseous detail. Interval
reduction of comminuted, displaced distal radius fracture. Dorsal
angulation has resolved. Up to 0.5 cm of dorsal displacement with
reduced impaction compared to earlier radiographs.
IMPRESSION: Interval reduction of the comminuted distal radius fracture with
resolution of dorsal angulation and reduced dorsal displacement and
impaction of the fracture fragments.

## 2024-01-11 ENCOUNTER — Other Ambulatory Visit: Payer: Self-pay | Admitting: Internal Medicine

## 2024-04-24 ENCOUNTER — Other Ambulatory Visit: Payer: Self-pay | Admitting: Internal Medicine

## 2024-08-01 ENCOUNTER — Other Ambulatory Visit: Payer: Self-pay | Admitting: Internal Medicine
